# Patient Record
Sex: Female | Born: 1945 | Race: White | Hispanic: No | Marital: Married | Smoking: Never smoker
Health system: Southern US, Community
[De-identification: ages and names within clinical notes are randomized; demographics above are authoritative.]

## PROBLEM LIST (undated history)

## (undated) DIAGNOSIS — E7800 Pure hypercholesterolemia, unspecified: Secondary | ICD-10-CM

---

## 2016-09-25 ENCOUNTER — Emergency Department (HOSPITAL_COMMUNITY)
Admission: EM | Admit: 2016-09-25 | Discharge: 2016-09-25 | Disposition: A | Discharge status: Home / Self Care | Payer: Medicare Other | Attending: Emergency Medicine | Admitting: Emergency Medicine

## 2016-09-25 ENCOUNTER — Encounter (HOSPITAL_COMMUNITY): Payer: Self-pay | Admitting: Emergency Medicine

## 2016-09-25 ENCOUNTER — Emergency Department (HOSPITAL_COMMUNITY): Payer: Medicare Other

## 2016-09-25 DIAGNOSIS — R0789 Other chest pain: Secondary | ICD-10-CM | POA: Diagnosis not present

## 2016-09-25 DIAGNOSIS — R079 Chest pain, unspecified: Secondary | ICD-10-CM

## 2016-09-25 HISTORY — DX: Pure hypercholesterolemia, unspecified: E78.00

## 2016-09-25 LAB — I-STAT TROPONIN, ED: Troponin i, poc: 0 ng/mL (ref 0.00–0.08)

## 2016-09-25 LAB — CBC
HEMATOCRIT: 40.1 % (ref 36.0–46.0)
Hemoglobin: 13.4 g/dL (ref 12.0–15.0)
MCH: 28.8 pg (ref 26.0–34.0)
MCHC: 33.4 g/dL (ref 30.0–36.0)
MCV: 86.2 fL (ref 78.0–100.0)
Platelets: 242 10*3/uL (ref 150–400)
RBC: 4.65 MIL/uL (ref 3.87–5.11)
RDW: 13.3 % (ref 11.5–15.5)
WBC: 16.2 10*3/uL — AB (ref 4.0–10.5)

## 2016-09-25 LAB — BASIC METABOLIC PANEL
Anion gap: 7 (ref 5–15)
BUN: 16 mg/dL (ref 6–20)
CHLORIDE: 106 mmol/L (ref 101–111)
CO2: 27 mmol/L (ref 22–32)
Calcium: 9.8 mg/dL (ref 8.9–10.3)
Creatinine, Ser: 0.73 mg/dL (ref 0.44–1.00)
GFR calc Af Amer: >60 mL/min (ref >60)
GFR calc non Af Amer: >60 mL/min (ref >60)
GLUCOSE: 116 mg/dL — AB (ref 65–99)
Potassium: 4.6 mmol/L (ref 3.5–5.1)
Sodium: 140 mmol/L (ref 135–145)

## 2016-09-25 NOTE — Discharge Instructions (Signed)
Continue taking your home medications as prescribed. I recommend fine up with your primary care provider after returning home early this week for follow-up evaluation. Please return to the Emergency Department if symptoms worsen or new onset of fever, lightheadedness, new/worsening chest pain, difficulty breathing, cough, her palpitations, abdominal pain, vomiting, numbness, weakness, syncope.

## 2016-09-25 NOTE — ED Triage Notes (Signed)
Pt reports CP when she wakes up since Tuesday. Went to PCP for same on Wednesday. Pain continued during car trip yesterday for 3 days. No SOB or dizziness.

## 2016-09-25 NOTE — ED Notes (Signed)
ED Provider at bedside. 

## 2016-09-25 NOTE — ED Provider Notes (Signed)
[facility]-EMERGENCY DEPT Provider Note   CSN: [id number] Arrival date & time: 09/25/16  1324     History   Chief Complaint Chief Complaint  Patient presents with  . Chest Pain    HPI Laura Lang is a 71 y.o. female.  HPI   Patient is a 71 year old female with history of hypertension and hyperlipidemia who presents to the ED with complaint of chest pain, onset 5 days. Patient states she has been having intermittent mid/right sided chest discomfort for the past 5 days which she describes as "dull ache and light pressure". Denies radiation. Endorses associated belching with the episodes of CP. Denies any precipitating or aggravating. Patient reports she has been evaluated by her PCP back, [place] earlier this week regarding her symptoms. She states she had an EKG performed which was unremarkable but notes she is scheduled to have an outpatient stress test performed for further evaluation. Patient states she has been treating her episodes of chest pain with half a Xanax, Tums and aspirin with resolution of symptoms. Patient reports her last episode of chest pain occurred this morning around 2 AM. She reports taking her medications listed above and notes when she woke up around 7 her symptoms resolved around 8 AM. She currently denies any pain or complaints. Patient reports typically having her episodes of chest pain occur in the morning. She notes she drove 7 hours from [place] yesterday and is planning on driving back tomorrow. Denies fever, chills, headache, lightheadedness, cough, shortness of breath, palpitations, abdominal pain, nausea, vomiting, diarrhea, numbness, weakness. Denies personal or family history of cardiac disease. Denies smoking. Denies history of cancer, recent hospitalizations or surgeries, history of DVT/PE, leg swelling.  Past Medical History:  Diagnosis Date  . Hypercholesteremia     There are no active problems to display for this patient.   History reviewed.  No pertinent surgical history.  OB History    No data available       Home Medications    Prior to Admission medications   Not on File    Family History History reviewed. No pertinent family history.  Social History Social History  Substance Use Topics  . Smoking status: Never Smoker  . Smokeless tobacco: Never Used  . Alcohol use No     Allergies   Shellfish allergy   Review of Systems Review of Systems  Cardiovascular: Positive for chest pain.  Gastrointestinal:       Belching  All other systems reviewed and are negative.    Physical Exam Updated Vital Signs BP (!) 144/101   Pulse (!) 103   Temp 98.2 F (36.8 C) (Oral)   Resp 16   SpO2 100%   Physical Exam  Constitutional: She is oriented to person, place, and time. She appears well-developed and well-nourished. No distress.  HENT:  Head: Normocephalic and atraumatic.  Mouth/Throat: Oropharynx is clear and moist. No oropharyngeal exudate.  Eyes: Conjunctivae and EOM are normal. Pupils are equal, round, and reactive to light. Right eye exhibits no discharge. Left eye exhibits no discharge. No scleral icterus.  Neck: Normal range of motion. Neck supple.  Cardiovascular: Normal rate, regular rhythm, normal heart sounds and intact distal pulses.   Pulmonary/Chest: Effort normal and breath sounds normal. No respiratory distress. She has no wheezes. She has no rales. She exhibits no tenderness.  Abdominal: Soft. Bowel sounds are normal. She exhibits no distension and no mass. There is no tenderness. There is no rebound and no guarding. No hernia.  Musculoskeletal: Normal  range of motion. She exhibits no edema.  Neurological: She is alert and oriented to person, place, and time.  Skin: Skin is warm and dry. She is not diaphoretic.  Nursing note and vitals reviewed.    ED Treatments / Results  Labs (all labs ordered are listed, but only abnormal results are displayed) Labs Reviewed  BASIC METABOLIC PANEL  - Abnormal; Notable for the following:       Result Value   Glucose, Bld 116 (*)    All other components within normal limits  CBC - Abnormal; Notable for the following:    WBC 16.2 (*)    All other components within normal limits  I-STAT TROPOININ, ED    EKG  EKG Interpretation None       Radiology Dg Chest 2 View  Result Date: 09/25/2016 CLINICAL DATA:  Patient with intermittent chest pain and heartburn for 4 days. EXAM: CHEST  2 VIEW COMPARISON:  None. FINDINGS: The heart size and mediastinal contours are within normal limits. Both lungs are clear. The visualized skeletal structures are unremarkable. IMPRESSION: No active cardiopulmonary disease. Electronically Signed   By: Annia Belt M.D.   On: 09/25/2016 10:06    Procedures Procedures (including critical care time)  Medications Ordered in ED Medications - No data to display   Initial Impression / Assessment and Plan / ED Course  I have reviewed the triage vital signs and the nursing notes.  Pertinent labs & imaging results that were available during my care of the patient were reviewed by me and considered in my medical decision making (see chart for details).     Patient presents with intermittent episodes of nonexertional chest pain for the past 5 days. Denies associated symptoms. Denies personal or family history of cardiac disease. VSS. Exam unremarkable. Patient denies any pain or complaints while in the ED. EKG showed sinus tachycardia, HR 101, no acute ischemic changes. Trop negative. CXR negative. Labs unremarkable. HEART score 3. Discussed pt with Dr. Judd Lien who also evaluated pt in the ED. I have a low suspicion for ACS, PE, dissection, or other acute cardiac event at this time. Chest pain does not appear to be cardiac in nature. Plan to discharge patient home and advise her to follow up with her PCP early this week for follow-up evaluation. Discussed return precautions.    Final Clinical Impressions(s) / ED  Diagnoses   Final diagnoses:  Nonspecific chest pain    New Prescriptions New Prescriptions   No medications on file     Barrett Henle, PA-C 09/25/16 1312    Geoffery Lyons, MD 09/25/16 1423

## 2018-04-11 IMAGING — CR DG CHEST 2V
2 series · 2 of 2 positions shown · non-contrast
Comparison: None.

CLINICAL DATA: Patient with intermittent chest pain and heartburn
for 4 days.

EXAM:
CHEST  2 VIEW

[w chest pa]
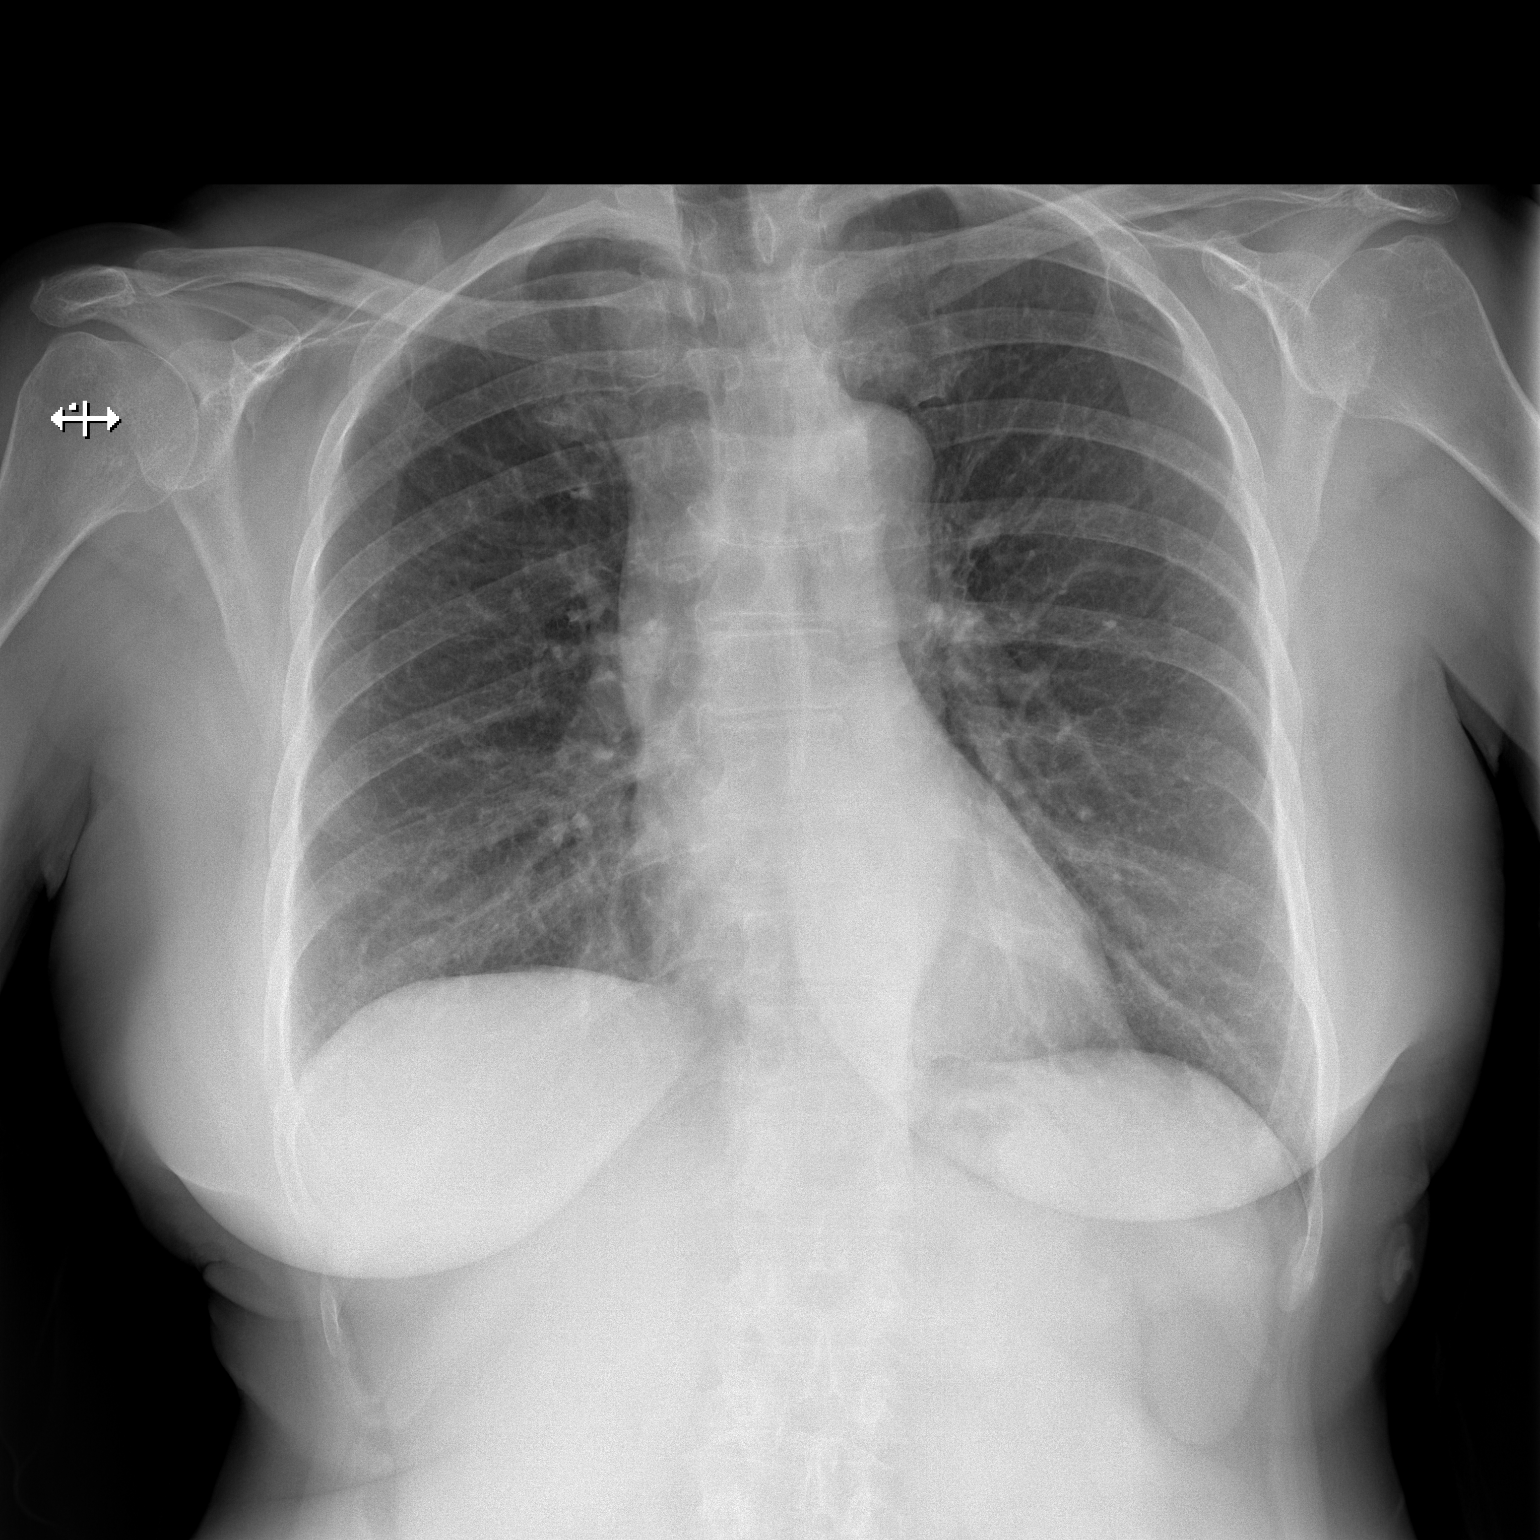

[w chest lat]
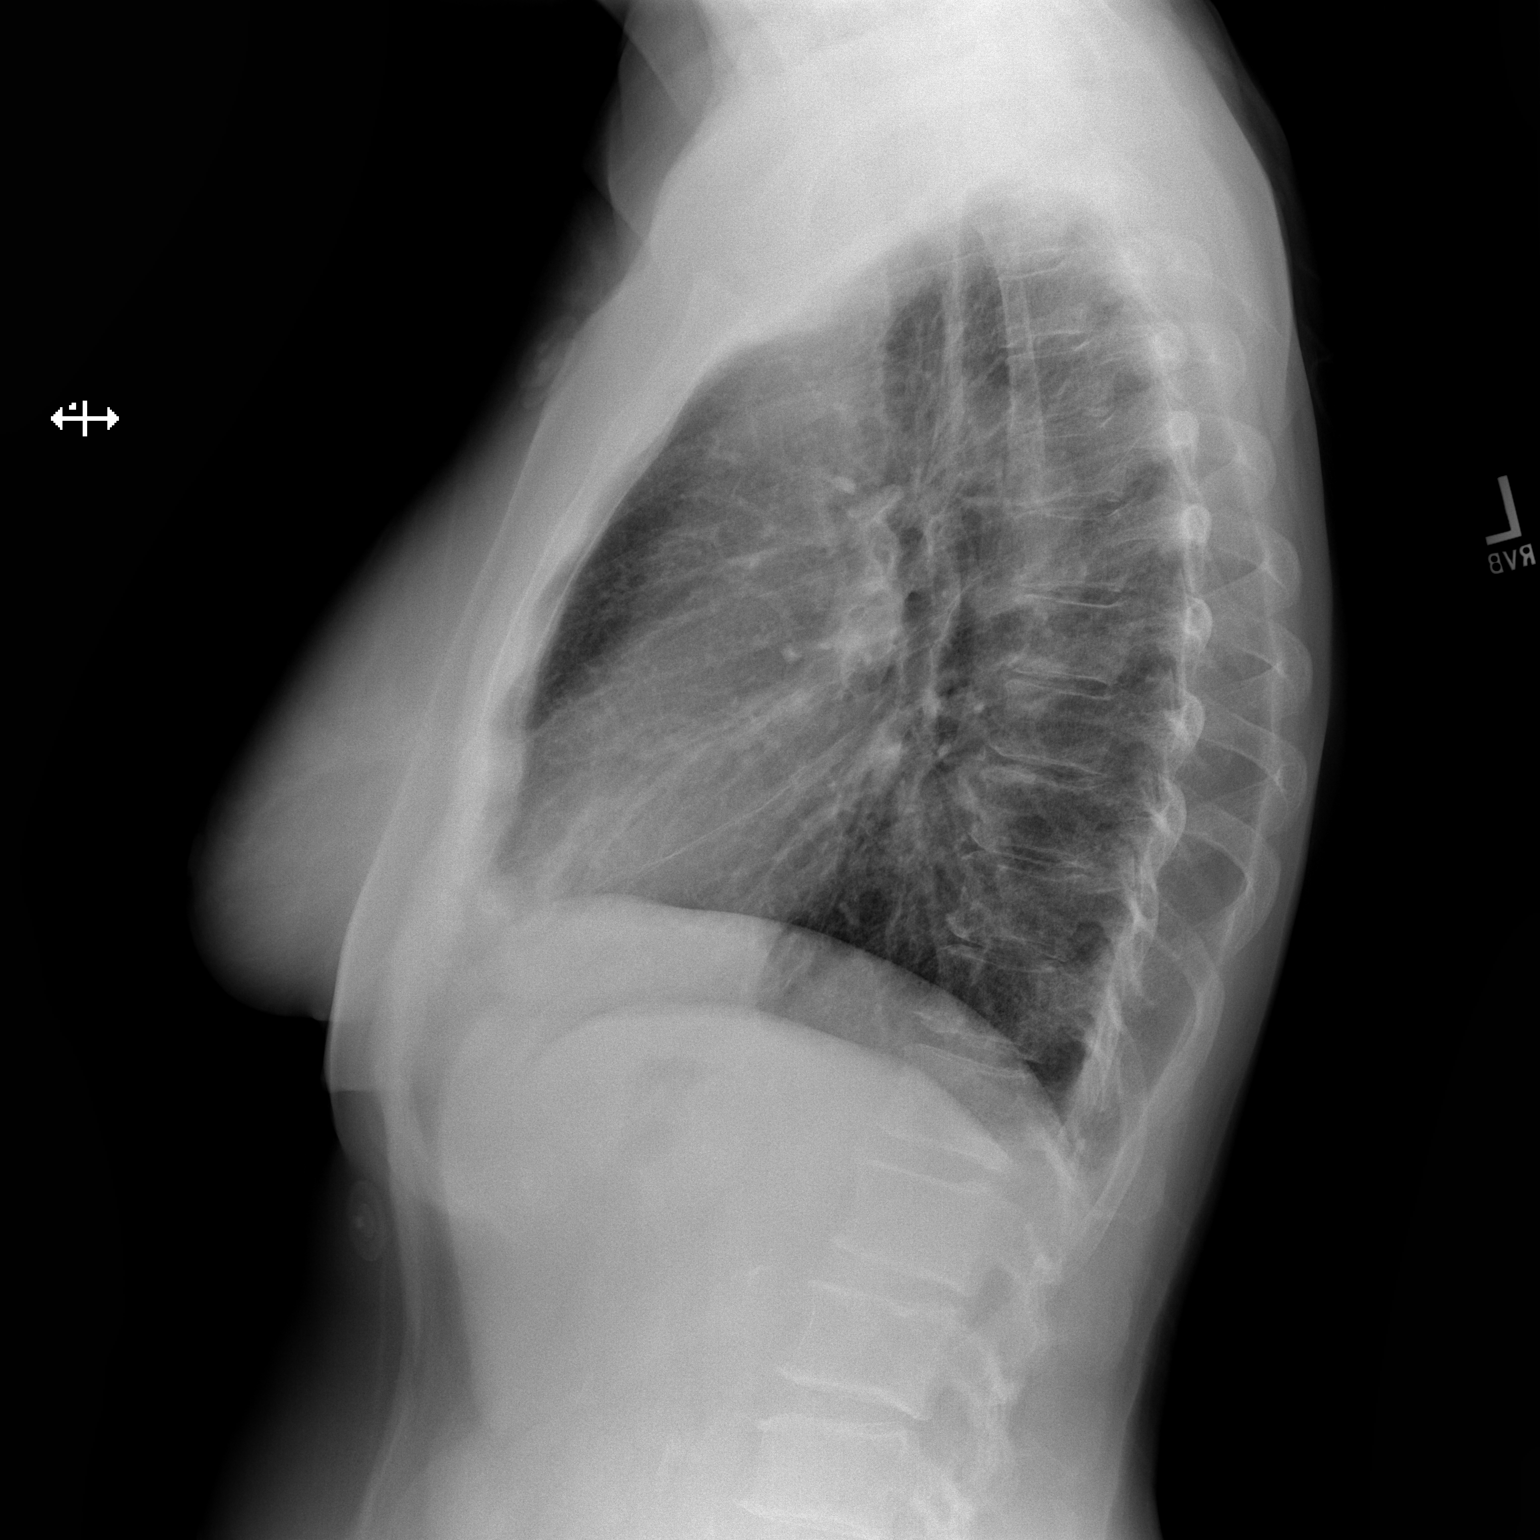

[2 of 2 positions shown; findings below may reference images not displayed]

FINDINGS: The heart size and mediastinal contours are within normal limits.
Both lungs are clear. The visualized skeletal structures are
unremarkable.
IMPRESSION: No active cardiopulmonary disease.
